# Patient Record
Sex: Female | Born: 1980 | Race: Black or African American | Hispanic: No | Marital: Single | Smoking: Never smoker
Health system: Southern US, Community
[De-identification: ages and names within clinical notes are randomized; demographics above are authoritative.]

## PROBLEM LIST (undated history)

## (undated) DIAGNOSIS — D649 Anemia, unspecified: Secondary | ICD-10-CM

## (undated) DIAGNOSIS — R51 Headache: Secondary | ICD-10-CM

## (undated) DIAGNOSIS — Z789 Other specified health status: Secondary | ICD-10-CM

## (undated) DIAGNOSIS — R519 Headache, unspecified: Secondary | ICD-10-CM

## (undated) DIAGNOSIS — K219 Gastro-esophageal reflux disease without esophagitis: Secondary | ICD-10-CM

## (undated) DIAGNOSIS — R42 Dizziness and giddiness: Secondary | ICD-10-CM

## (undated) HISTORY — PX: NO PAST SURGERIES: SHX2092

---

## 2002-05-03 ENCOUNTER — Emergency Department (HOSPITAL_COMMUNITY): Admission: EM | Admit: 2002-05-03 | Discharge: 2002-05-03 | Payer: Self-pay | Admitting: Emergency Medicine

## 2013-03-19 ENCOUNTER — Other Ambulatory Visit: Payer: Self-pay | Admitting: Internal Medicine

## 2013-03-19 DIAGNOSIS — N63 Unspecified lump in unspecified breast: Secondary | ICD-10-CM

## 2013-03-27 ENCOUNTER — Ambulatory Visit
Admission: RE | Admit: 2013-03-27 | Discharge: 2013-03-27 | Disposition: A | Discharge status: Home / Self Care | Payer: BC Managed Care – PPO | Source: Ambulatory Visit | Attending: Internal Medicine | Admitting: Internal Medicine

## 2013-03-27 DIAGNOSIS — N63 Unspecified lump in unspecified breast: Secondary | ICD-10-CM

## 2013-12-22 ENCOUNTER — Other Ambulatory Visit: Payer: Self-pay | Admitting: Obstetrics and Gynecology

## 2014-01-02 ENCOUNTER — Encounter (HOSPITAL_COMMUNITY): Payer: Self-pay | Admitting: *Deleted

## 2014-01-02 ENCOUNTER — Inpatient Hospital Stay (HOSPITAL_COMMUNITY)
Admission: AD | Admit: 2014-01-02 | Discharge: 2014-01-02 | Disposition: A | Discharge status: Home / Self Care | Payer: BC Managed Care – PPO | Source: Ambulatory Visit | Attending: Obstetrics & Gynecology | Admitting: Obstetrics & Gynecology

## 2014-01-02 DIAGNOSIS — D649 Anemia, unspecified: Secondary | ICD-10-CM | POA: Insufficient documentation

## 2014-01-02 HISTORY — DX: Other specified health status: Z78.9

## 2014-01-02 MED ORDER — SODIUM CHLORIDE 0.9 % IV SOLN
1020.0000 mg | Freq: Once | INTRAVENOUS | Status: AC
  Administered 2014-01-02: 1020 mg via INTRAVENOUS
  Filled 2014-01-02: qty 34

## 2014-01-02 NOTE — MAU Note (Signed)
Patient sent by physician for an Iron infusion today.

## 2014-04-12 ENCOUNTER — Encounter (HOSPITAL_COMMUNITY): Payer: Self-pay | Admitting: *Deleted

## 2014-04-26 ENCOUNTER — Other Ambulatory Visit: Payer: Self-pay | Admitting: Family

## 2014-04-26 DIAGNOSIS — R635 Abnormal weight gain: Secondary | ICD-10-CM

## 2014-04-26 DIAGNOSIS — D352 Benign neoplasm of pituitary gland: Secondary | ICD-10-CM

## 2014-04-26 DIAGNOSIS — D353 Benign neoplasm of craniopharyngeal duct: Secondary | ICD-10-CM

## 2014-05-05 ENCOUNTER — Ambulatory Visit
Admission: RE | Admit: 2014-05-05 | Discharge: 2014-05-05 | Disposition: A | Discharge status: Home / Self Care | Payer: BC Managed Care – PPO | Source: Ambulatory Visit | Attending: Family | Admitting: Family

## 2014-05-05 DIAGNOSIS — R635 Abnormal weight gain: Secondary | ICD-10-CM

## 2014-05-05 DIAGNOSIS — D353 Benign neoplasm of craniopharyngeal duct: Secondary | ICD-10-CM

## 2014-05-05 DIAGNOSIS — D352 Benign neoplasm of pituitary gland: Secondary | ICD-10-CM

## 2014-05-05 MED ORDER — GADOBENATE DIMEGLUMINE 529 MG/ML IV SOLN
10.0000 mL | Freq: Once | INTRAVENOUS | Status: AC | PRN
  Administered 2014-05-05: 10 mL via INTRAVENOUS

## 2014-06-24 ENCOUNTER — Other Ambulatory Visit: Payer: Self-pay | Admitting: Family

## 2014-06-24 ENCOUNTER — Ambulatory Visit
Admission: RE | Admit: 2014-06-24 | Discharge: 2014-06-24 | Disposition: A | Discharge status: Home / Self Care | Payer: BC Managed Care – PPO | Source: Ambulatory Visit | Attending: Family | Admitting: Family

## 2014-06-24 DIAGNOSIS — M2560 Stiffness of unspecified joint, not elsewhere classified: Secondary | ICD-10-CM

## 2014-06-24 DIAGNOSIS — R2689 Other abnormalities of gait and mobility: Secondary | ICD-10-CM

## 2014-06-24 DIAGNOSIS — R29898 Other symptoms and signs involving the musculoskeletal system: Secondary | ICD-10-CM

## 2014-09-23 ENCOUNTER — Other Ambulatory Visit: Payer: Self-pay | Admitting: Family

## 2014-09-23 DIAGNOSIS — R2231 Localized swelling, mass and lump, right upper limb: Secondary | ICD-10-CM

## 2014-09-23 DIAGNOSIS — N6320 Unspecified lump in the left breast, unspecified quadrant: Secondary | ICD-10-CM

## 2014-09-28 ENCOUNTER — Ambulatory Visit
Admission: RE | Admit: 2014-09-28 | Discharge: 2014-09-28 | Disposition: A | Discharge status: Home / Self Care | Payer: BC Managed Care – PPO | Source: Ambulatory Visit | Attending: Family | Admitting: Family

## 2014-09-28 DIAGNOSIS — N6320 Unspecified lump in the left breast, unspecified quadrant: Secondary | ICD-10-CM

## 2014-09-28 DIAGNOSIS — R2231 Localized swelling, mass and lump, right upper limb: Secondary | ICD-10-CM

## 2014-12-06 ENCOUNTER — Other Ambulatory Visit: Payer: Self-pay | Admitting: Endocrinology

## 2014-12-06 DIAGNOSIS — Z87898 Personal history of other specified conditions: Secondary | ICD-10-CM

## 2015-01-03 ENCOUNTER — Other Ambulatory Visit: Payer: BC Managed Care – PPO

## 2015-01-04 ENCOUNTER — Ambulatory Visit
Admission: RE | Admit: 2015-01-04 | Discharge: 2015-01-04 | Disposition: A | Discharge status: Home / Self Care | Payer: BC Managed Care – PPO | Source: Ambulatory Visit | Attending: Endocrinology | Admitting: Endocrinology

## 2015-01-04 DIAGNOSIS — Z87898 Personal history of other specified conditions: Secondary | ICD-10-CM

## 2015-01-04 MED ORDER — GADOBENATE DIMEGLUMINE 529 MG/ML IV SOLN
10.0000 mL | Freq: Once | INTRAVENOUS | Status: AC | PRN
  Administered 2015-01-04: 10 mL via INTRAVENOUS

## 2015-01-07 ENCOUNTER — Other Ambulatory Visit: Payer: BC Managed Care – PPO

## 2015-12-29 ENCOUNTER — Other Ambulatory Visit: Payer: Self-pay | Admitting: Obstetrics and Gynecology

## 2016-01-02 ENCOUNTER — Inpatient Hospital Stay (HOSPITAL_COMMUNITY)
Admission: AD | Admit: 2016-01-02 | Discharge: 2016-01-02 | Disposition: A | Discharge status: Home / Self Care | Payer: BLUE CROSS/BLUE SHIELD | Source: Ambulatory Visit | Attending: Obstetrics and Gynecology | Admitting: Obstetrics and Gynecology

## 2016-01-02 DIAGNOSIS — D509 Iron deficiency anemia, unspecified: Secondary | ICD-10-CM | POA: Insufficient documentation

## 2016-01-02 MED ORDER — SODIUM CHLORIDE 0.9 % IV SOLN
510.0000 mg | Freq: Once | INTRAVENOUS | Status: AC
  Administered 2016-01-02: 510 mg via INTRAVENOUS
  Filled 2016-01-02: qty 17

## 2016-01-02 NOTE — MAU Note (Signed)
Pt presents for iron infusion. Denies complaints at this time.

## 2016-01-02 NOTE — MAU Note (Signed)
Urine in lab 

## 2016-01-09 ENCOUNTER — Other Ambulatory Visit: Payer: Self-pay | Admitting: Obstetrics and Gynecology

## 2016-01-10 ENCOUNTER — Inpatient Hospital Stay (HOSPITAL_COMMUNITY)
Admission: AD | Admit: 2016-01-10 | Discharge: 2016-01-10 | Disposition: A | Discharge status: Home / Self Care | Payer: BLUE CROSS/BLUE SHIELD | Source: Ambulatory Visit | Attending: Obstetrics and Gynecology | Admitting: Obstetrics and Gynecology

## 2016-01-10 DIAGNOSIS — D509 Iron deficiency anemia, unspecified: Secondary | ICD-10-CM | POA: Insufficient documentation

## 2016-01-10 MED ORDER — SODIUM CHLORIDE 0.9 % IV SOLN
510.0000 mg | Freq: Once | INTRAVENOUS | Status: AC
  Administered 2016-01-10: 510 mg via INTRAVENOUS
  Filled 2016-01-10: qty 17

## 2016-01-10 NOTE — Progress Notes (Signed)
Notified of pt arrival in MAU for iron infusion. Ok to discharge home after iron infusion per MD

## 2016-01-10 NOTE — MAU Note (Signed)
Pt here for IV iron infusion. Denies complaints.

## 2016-01-12 ENCOUNTER — Other Ambulatory Visit: Payer: Self-pay | Admitting: Obstetrics and Gynecology

## 2016-01-26 ENCOUNTER — Encounter (HOSPITAL_COMMUNITY)
Admission: RE | Admit: 2016-01-26 | Discharge: 2016-01-26 | Disposition: A | Discharge status: Home / Self Care | Payer: BLUE CROSS/BLUE SHIELD | Source: Ambulatory Visit | Attending: Obstetrics and Gynecology | Admitting: Obstetrics and Gynecology

## 2016-01-26 ENCOUNTER — Encounter (HOSPITAL_COMMUNITY): Payer: Self-pay

## 2016-01-26 DIAGNOSIS — Z01818 Encounter for other preprocedural examination: Secondary | ICD-10-CM | POA: Insufficient documentation

## 2016-01-26 HISTORY — DX: Headache: R51

## 2016-01-26 HISTORY — DX: Dizziness and giddiness: R42

## 2016-01-26 HISTORY — DX: Headache, unspecified: R51.9

## 2016-01-26 HISTORY — DX: Anemia, unspecified: D64.9

## 2016-01-26 HISTORY — DX: Gastro-esophageal reflux disease without esophagitis: K21.9

## 2016-01-26 LAB — CBC
HEMATOCRIT: 33.6 % — AB (ref 36.0–46.0)
Hemoglobin: 10.5 g/dL — ABNORMAL LOW (ref 12.0–15.0)
MCH: 22.4 pg — AB (ref 26.0–34.0)
MCHC: 31.3 g/dL (ref 30.0–36.0)
MCV: 71.8 fL — AB (ref 78.0–100.0)
Platelets: 293 10*3/uL (ref 150–400)
RBC: 4.68 MIL/uL (ref 3.87–5.11)
RDW: 23.9 % — ABNORMAL HIGH (ref 11.5–15.5)
WBC: 2 10*3/uL — ABNORMAL LOW (ref 4.0–10.5)

## 2016-01-26 NOTE — Pre-Procedure Instructions (Signed)
Dr. Ola Spurr made aware of Alexandria Stanley's elevated blood pressure today at preop appointment.  Alexandria Stanley denies headache or visual changes.  She does state her father and paternal grandmother has high blood pressure.  Alexandria Stanley does not currently have a primary care provider.  I notified [place]  via voicemail at Dr. Harvie Bridge office that Dr. Ola Spurr recommended Alexandria Stanley be seen by a PCP prior to surgery for blood pressure management.

## 2016-01-26 NOTE — Patient Instructions (Signed)
Your procedure is scheduled on:  Friday, February 03, 2016  Enter through the Main Entrance of [facility] at:  8:45 AM  Pick up the phone at the desk and dial [phone].  Call this number if you have problems the morning of surgery: [phone].  Remember: Do NOT eat food or drink after:  Midnight Thursday, February 02, 2016  Take these medicines the morning of surgery with a SIP OF WATER:  None  Do NOT wear jewelry (body piercing), metal hair clips/bobby pins, make-up, or nail polish. Do NOT wear lotions, powders, or perfumes.  You may wear deodorant. Do NOT shave for 48 hours prior to surgery. Do NOT bring valuables to the hospital. Contacts, dentures, or bridgework may not be worn into surgery.  Have a responsible adult drive you home and stay with you for 24 hours after your procedure

## 2016-02-03 ENCOUNTER — Encounter (HOSPITAL_COMMUNITY): Payer: Self-pay

## 2016-02-03 ENCOUNTER — Ambulatory Visit (HOSPITAL_COMMUNITY): Payer: BLUE CROSS/BLUE SHIELD | Admitting: Anesthesiology

## 2016-02-03 ENCOUNTER — Ambulatory Visit (HOSPITAL_COMMUNITY)
Admission: RE | Admit: 2016-02-03 | Discharge: 2016-02-03 | Disposition: A | Discharge status: Home / Self Care | Payer: BLUE CROSS/BLUE SHIELD | Source: Ambulatory Visit | Attending: Obstetrics and Gynecology | Admitting: Obstetrics and Gynecology

## 2016-02-03 ENCOUNTER — Encounter (HOSPITAL_COMMUNITY)
Admission: RE | Disposition: A | Discharge status: Home / Self Care | Payer: Self-pay | Source: Ambulatory Visit | Attending: Obstetrics and Gynecology

## 2016-02-03 DIAGNOSIS — D509 Iron deficiency anemia, unspecified: Secondary | ICD-10-CM | POA: Insufficient documentation

## 2016-02-03 DIAGNOSIS — N920 Excessive and frequent menstruation with regular cycle: Secondary | ICD-10-CM | POA: Insufficient documentation

## 2016-02-03 DIAGNOSIS — K219 Gastro-esophageal reflux disease without esophagitis: Secondary | ICD-10-CM | POA: Diagnosis not present

## 2016-02-03 DIAGNOSIS — N840 Polyp of corpus uteri: Secondary | ICD-10-CM | POA: Insufficient documentation

## 2016-02-03 DIAGNOSIS — N9489 Other specified conditions associated with female genital organs and menstrual cycle: Secondary | ICD-10-CM

## 2016-02-03 HISTORY — PX: DILATATION & CURETTAGE/HYSTEROSCOPY WITH MYOSURE: SHX6511

## 2016-02-03 LAB — PREGNANCY, URINE: PREG TEST UR: NEGATIVE

## 2016-02-03 SURGERY — DILATATION & CURETTAGE/HYSTEROSCOPY WITH MYOSURE
Anesthesia: General | Site: Vagina

## 2016-02-03 MED ORDER — SCOPOLAMINE 1 MG/3DAYS TD PT72
1.0000 | MEDICATED_PATCH | Freq: Once | TRANSDERMAL | Status: DC
Start: 2016-02-03 — End: 2016-02-03
  Administered 2016-02-03: 1.5 mg via TRANSDERMAL

## 2016-02-03 MED ORDER — FENTANYL CITRATE (PF) 100 MCG/2ML IJ SOLN
INTRAMUSCULAR | Status: DC | PRN
  Administered 2016-02-03 (×2): 50 ug via INTRAVENOUS

## 2016-02-03 MED ORDER — DEXAMETHASONE SODIUM PHOSPHATE 4 MG/ML IJ SOLN
INTRAMUSCULAR | Status: AC
  Filled 2016-02-03: qty 1

## 2016-02-03 MED ORDER — PROPOFOL 10 MG/ML IV BOLUS
INTRAVENOUS | Status: AC
  Filled 2016-02-03: qty 20

## 2016-02-03 MED ORDER — PROMETHAZINE HCL 25 MG/ML IJ SOLN: 6.2500 mg | INTRAMUSCULAR | Status: DC | PRN

## 2016-02-03 MED ORDER — LIDOCAINE HCL (CARDIAC) 20 MG/ML IV SOLN
INTRAVENOUS | Status: DC | PRN
  Administered 2016-02-03: 100 mg via INTRAVENOUS

## 2016-02-03 MED ORDER — LIDOCAINE HCL (CARDIAC) 20 MG/ML IV SOLN
INTRAVENOUS | Status: AC
  Filled 2016-02-03: qty 5

## 2016-02-03 MED ORDER — FENTANYL CITRATE (PF) 100 MCG/2ML IJ SOLN
INTRAMUSCULAR | Status: AC
  Filled 2016-02-03: qty 2

## 2016-02-03 MED ORDER — PROPOFOL 10 MG/ML IV BOLUS
INTRAVENOUS | Status: DC | PRN
  Administered 2016-02-03: 200 mg via INTRAVENOUS

## 2016-02-03 MED ORDER — FENTANYL CITRATE (PF) 100 MCG/2ML IJ SOLN: 25.0000 ug | INTRAMUSCULAR | Status: DC | PRN

## 2016-02-03 MED ORDER — MIDAZOLAM HCL 2 MG/2ML IJ SOLN
INTRAMUSCULAR | Status: DC | PRN
  Administered 2016-02-03: 2 mg via INTRAVENOUS

## 2016-02-03 MED ORDER — ONDANSETRON HCL 4 MG/2ML IJ SOLN
INTRAMUSCULAR | Status: DC | PRN
  Administered 2016-02-03: 4 mg via INTRAVENOUS

## 2016-02-03 MED ORDER — LACTATED RINGERS IV SOLN
INTRAVENOUS | Status: DC
  Administered 2016-02-03 (×2): via INTRAVENOUS

## 2016-02-03 MED ORDER — SCOPOLAMINE 1 MG/3DAYS TD PT72
MEDICATED_PATCH | TRANSDERMAL | Status: AC
  Administered 2016-02-03: 1.5 mg via TRANSDERMAL
  Filled 2016-02-03: qty 1

## 2016-02-03 MED ORDER — DEXAMETHASONE SODIUM PHOSPHATE 10 MG/ML IJ SOLN
INTRAMUSCULAR | Status: DC | PRN
Start: 2016-02-03 — End: 2016-02-03
  Administered 2016-02-03: 4 mg via INTRAVENOUS

## 2016-02-03 MED ORDER — IBUPROFEN 800 MG PO TABS: 800.0000 mg | ORAL_TABLET | Freq: Three times a day (TID) | ORAL | 0 refills | Status: AC | PRN

## 2016-02-03 MED ORDER — KETOROLAC TROMETHAMINE 30 MG/ML IJ SOLN
INTRAMUSCULAR | Status: DC | PRN
  Administered 2016-02-03: 30 mg via INTRAMUSCULAR
  Administered 2016-02-03: 30 mg via INTRAVENOUS

## 2016-02-03 MED ORDER — HYDRALAZINE HCL 20 MG/ML IJ SOLN
INTRAMUSCULAR | Status: DC
Start: 2016-02-03 — End: 2016-02-03
  Filled 2016-02-03: qty 1

## 2016-02-03 MED ORDER — HYDRALAZINE HCL 20 MG/ML IJ SOLN
5.0000 mg | Freq: Once | INTRAMUSCULAR | Status: AC
  Administered 2016-02-03: 5 mg via INTRAVENOUS

## 2016-02-03 MED ORDER — ONDANSETRON HCL 4 MG/2ML IJ SOLN
INTRAMUSCULAR | Status: AC
  Filled 2016-02-03: qty 2

## 2016-02-03 MED ORDER — KETOROLAC TROMETHAMINE 30 MG/ML IJ SOLN
INTRAMUSCULAR | Status: AC
  Filled 2016-02-03: qty 2

## 2016-02-03 MED ORDER — MIDAZOLAM HCL 2 MG/2ML IJ SOLN
INTRAMUSCULAR | Status: AC
  Filled 2016-02-03: qty 2

## 2016-02-03 MED ORDER — SODIUM CHLORIDE 0.9 % IR SOLN
Status: DC | PRN
  Administered 2016-02-03: 3000 mL

## 2016-02-03 SURGICAL SUPPLY — 20 items
CANISTER SUCT 3000ML (MISCELLANEOUS) ×3 IMPLANT
CATH ROBINSON RED A/P 16FR (CATHETERS) ×3 IMPLANT
CLOTH BEACON ORANGE TIMEOUT ST (SAFETY) ×3 IMPLANT
CONTAINER PREFILL 10% NBF 60ML (FORM) ×6 IMPLANT
DEVICE MYOSURE LITE (MISCELLANEOUS) ×3 IMPLANT
DEVICE MYOSURE REACH (MISCELLANEOUS) IMPLANT
ELECT REM PT RETURN 9FT ADLT (ELECTROSURGICAL) ×3
ELECTRODE REM PT RTRN 9FT ADLT (ELECTROSURGICAL) ×1 IMPLANT
FILTER ARTHROSCOPY CONVERTOR (FILTER) ×3 IMPLANT
GLOVE BIOGEL PI IND STRL 7.0 (GLOVE) ×2 IMPLANT
GLOVE BIOGEL PI INDICATOR 7.0 (GLOVE) ×4
GLOVE ECLIPSE 6.5 STRL STRAW (GLOVE) ×3 IMPLANT
GOWN STRL REUS W/TWL LRG LVL3 (GOWN DISPOSABLE) ×6 IMPLANT
PACK VAGINAL MINOR WOMEN LF (CUSTOM PROCEDURE TRAY) ×3 IMPLANT
PAD OB MATERNITY 4.3X12.25 (PERSONAL CARE ITEMS) ×3 IMPLANT
SEAL ROD LENS SCOPE MYOSURE (ABLATOR) ×3 IMPLANT
TOWEL OR 17X24 6PK STRL BLUE (TOWEL DISPOSABLE) ×6 IMPLANT
TUBING AQUILEX INFLOW (TUBING) ×3 IMPLANT
TUBING AQUILEX OUTFLOW (TUBING) ×3 IMPLANT
WATER STERILE IRR 1000ML POUR (IV SOLUTION) ×3 IMPLANT

## 2016-02-03 NOTE — Anesthesia Procedure Notes (Signed)
Procedure Name: LMA Insertion Date/Time: 02/03/2016 9:50 AM Performed by: Bartt Gonzaga, Sheron Nightingale Pre-anesthesia Checklist: Patient identified, Timeout performed, Emergency Drugs available, Suction available and Patient being monitored Patient Re-evaluated:Patient Re-evaluated prior to inductionOxygen Delivery Method: Circle system utilized Preoxygenation: Pre-oxygenation with 100% oxygen Intubation Type: IV induction LMA Size: 4.0 Number of attempts: 1 ETT to lip (cm): at lips. Tube secured with: Tape Dental Injury: Teeth and Oropharynx as per pre-operative assessment

## 2016-02-03 NOTE — Brief Op Note (Signed)
02/03/2016  10:35 AM  PATIENT:  Alexandria Stanley  35 y.o. female  PRE-OPERATIVE DIAGNOSIS:  Menorrhagia, Iron Deficiency Anemia, Endometrial Mass  POST-OPERATIVE DIAGNOSIS:  Menorrhagia, Iron Deficiency Anemia, Endometrial Mass  PROCEDURE:  Diagnostic hysteroscopy, dilation and curettage, hysteroscopic resection of endometrial polyp using  myosure  SURGEON:  Surgeon(s) and Role:    * Servando Salina, MD - Primary  PHYSICIAN ASSISTANT:   ASSISTANTS: none   ANESTHESIA:   general  EBL:  Total I/O In: -  Out: 27 [Urine:60; Blood:10]  BLOOD ADMINISTERED:none  DRAINS: none   LOCAL MEDICATIONS USED:  NONE  SPECIMEN:  Source of Specimen:  emc with polyp  DISPOSITION OF SPECIMEN:  PATHOLOGY  COUNTS:  YES  TOURNIQUET:  * No tourniquets in log *  DICTATION: .Other Dictation: Dictation Number [id number]  PLAN OF CARE: Discharge to home after PACU  PATIENT DISPOSITION:  PACU - hemodynamically stable.   Delay start of Pharmacological VTE agent (>24hrs) due to surgical blood loss or risk of bleeding: no

## 2016-02-03 NOTE — Anesthesia Preprocedure Evaluation (Addendum)
Anesthesia Evaluation  Patient identified by MRN, date of birth, ID band Patient awake    Reviewed: Allergy & Precautions, NPO status , Patient's Chart, lab work & pertinent test results  Airway Mallampati: II  TM Distance: >3 FB Neck ROM: Full    Dental  (+) Teeth Intact, Dental Advisory Given, Chipped,    Pulmonary neg pulmonary ROS,    Pulmonary exam normal breath sounds clear to auscultation       Cardiovascular Exercise Tolerance: Good negative cardio ROS Normal cardiovascular exam Rhythm:Regular Rate:Normal     Neuro/Psych  Headaches, pituitary micoradenoma negative psych ROS   GI/Hepatic Neg liver ROS, GERD  Medicated,  Endo/Other  Morbid obesity  Renal/GU negative Renal ROS     Musculoskeletal negative musculoskeletal ROS (+)   Abdominal   Peds  Hematology  (+) Blood dyscrasia, anemia ,   Anesthesia Other Findings Day of surgery medications reviewed with the patient.  Reproductive/Obstetrics negative OB ROS                           Anesthesia Physical Anesthesia Plan  ASA: III  Anesthesia Plan: General   Post-op Pain Management:    Induction: Intravenous  Airway Management Planned: LMA  Additional Equipment:   Intra-op Plan:   Post-operative Plan: Extubation in OR  Informed Consent: I have reviewed the patients History and Physical, chart, labs and discussed the procedure including the risks, benefits and alternatives for the proposed anesthesia with the patient or authorized representative who has indicated his/her understanding and acceptance.   Dental advisory given  Plan Discussed with: CRNA  Anesthesia Plan Comments: (Risks/benefits of general anesthesia discussed with patient including risk of damage to teeth, lips, gum, and tongue, nausea/vomiting, allergic reactions to medications, and the possibility of heart attack, stroke and death.  All patient questions  answered.  Patient wishes to proceed.)        Anesthesia Quick Evaluation

## 2016-02-03 NOTE — Discharge Instructions (Signed)
CALL  IF TEMP>100.4, NOTHING PER VAGINA X 1 WK, CALL IF SOAKING A MAXI  PAD EVERY HOUR OR MORE FREQUENTLY  DISCHARGE INSTRUCTIONS: HYSTEROSCOPY  The following instructions have been prepared to help you care for yourself upon your return home.  May Remove Scop patch on or before 02/06/16.  May take Ibuprofen after 4:00pm today.  May take stool softner while taking narcotic pain medication to prevent constipation.  Drink plenty of water.  Personal hygiene:  Use sanitary pads for vaginal drainage, not tampons.  Shower the day after your procedure.  NO tub baths, pools or Jacuzzis for 2-3 weeks.  Wipe front to back after using the bathroom.  Activity and limitations:  Do NOT drive or operate any equipment for 24 hours. The effects of anesthesia are still present and drowsiness may result.  Do NOT rest in bed all day.  Walking is encouraged.  Walk up and down stairs slowly.  You may resume your normal activity in one to two days or as indicated by your physician. Sexual activity: NO intercourse for at least 2 weeks after the procedure, or as indicated by your Doctor.  Diet: Eat a light meal as desired this evening. You may resume your usual diet tomorrow.  Return to Work: You may resume your work activities in one to two days or as indicated by Marine scientist.  What to expect after your surgery: Expect to have vaginal bleeding/discharge for 2-3 days and spotting for up to 10 days. It is not unusual to have soreness for up to 1-2 weeks. You may have a slight burning sensation when you urinate for the first day. Mild cramps may continue for a couple of days. You may have a regular period in 2-6 weeks.  Call your doctor for any of the following:  Excessive vaginal bleeding or clotting, saturating and changing one pad every hour.  Inability to urinate 6 hours after discharge from hospital.  Pain not relieved by pain medication.  Fever of 100.4 F or greater.  Unusual  vaginal discharge or odor.  Return to office _________________Call for an appointment ___________________ Patients signature: ______________________ Nurses signature ________________________  Post Anesthesia Care Unit [phone]      Post Anesthesia Home Care Instructions  Activity: Get plenty of rest for the remainder of the day. A responsible adult should stay with you for 24 hours following the procedure.  For the next 24 hours, DO NOT: -Drive a car -Paediatric nurse -Drink alcoholic beverages -Take any medication unless instructed by your physician -Make any legal decisions or sign important papers.  Meals: Start with liquid foods such as gelatin or soup. Progress to regular foods as tolerated. Avoid greasy, spicy, heavy foods. If nausea and/or vomiting occur, drink only clear liquids until the nausea and/or vomiting subsides. Call your physician if vomiting continues.  Special Instructions/Symptoms: Your throat may feel dry or sore from the anesthesia or the breathing tube placed in your throat during surgery. If this causes discomfort, gargle with warm salt water. The discomfort should disappear within 24 hours.  If you had a scopolamine patch placed behind your ear for the management of post- operative nausea and/or vomiting:  1. The medication in the patch is effective for 72 hours, after which it should be removed.  Wrap patch in a tissue and discard in the trash. Wash hands thoroughly with soap and water. 2. You may remove the patch earlier than 72 hours if you experience unpleasant side effects which may include dry mouth,  dizziness or visual disturbances. 3. Avoid touching the patch. Wash your hands with soap and water after contact with the patch.

## 2016-02-03 NOTE — H&P (Addendum)
Alexandria Stanley is an 35 y.o. female. BF presents for surgical mgmt of heavy menses with associated IDA. Sonogram done showed endometrial mass suggestive of polyps  Pertinent Gynecological History: Menses: flow is excessive with use of 6 pads or tampons on heaviest days Bleeding: menorrhagia Contraception: none DES exposure: denies Blood transfusions: none Sexually transmitted diseases: no past history Previous GYN Procedures: none  Last mammogram: n/a Date: none Last pap: normal Date:12/28/2015 OB History: G1  0010   Menstrual History: Menarche age: n/a Patient's last menstrual period was 01/16/2016 (approximate).    Past Medical History:  Diagnosis Date  . Anemia   . GERD (gastroesophageal reflux disease)   . Headache    Migraine  . Medical history non-contributory   . Vertigo   pituitary micoradenoma Hx IV iron infusions  Past Surgical History:  Procedure Laterality Date  . NO PAST SURGERIES      No family history on file.  Social History:  reports that she has never smoked. She has never used smokeless tobacco. She reports that she drinks alcohol. She reports that she does not use drugs.  Allergies:  Allergies  Allergen Reactions  . Silicone Rash and Other (See Comments)    Causes skin to peel off.    Prescriptions Prior to Admission  Medication Sig Dispense Refill Last Dose  . acetaminophen (TYLENOL) 500 MG tablet Take 1,000 mg by mouth every 6 (six) hours as needed for headache.     . Vitamin D, Ergocalciferol, (DRISDOL) 50000 units CAPS capsule Take 50,000 Units by mouth every 7 (seven) days. Takes on Friday with dinner.  0     Review of Systems  All other systems reviewed and are negative.   Last menstrual period 01/16/2016. Physical Exam  Constitutional: She is oriented to person, place, and time. She appears well-developed and well-nourished.  HENT:  Head: Atraumatic.  Eyes: EOM are normal.  Neck: Neck supple.  Cardiovascular: Regular rhythm.    Respiratory: Breath sounds normal.  GI: Bowel sounds are normal.  Genitourinary: Vagina normal and uterus normal.  Musculoskeletal: She exhibits no edema.  Neurological: She is alert and oriented to person, place, and time.  Skin: Skin is warm and dry.  Psychiatric: She has a normal mood and affect.  adnexa. nonpalp Cervix parous  No results found for this or any previous visit (from the past 24 hour(s)).  No results found.  Assessment/Plan: Menorrhagia with regular cycles IDA Endometrial mass P) dx hysteroscopy,  Hysteroscopic resection of endometrial mass, D&C. Risk of surgery reviewed including infection, bleeding, injury to surrounding organ structures, internal scar tissue, fluid overload and its mgmt, uterine perforation and its risk, thermal injury. All ? answered  Lulamae Skorupski A 02/03/2016, 8:26 AM

## 2016-02-03 NOTE — Transfer of Care (Signed)
Immediate Anesthesia Transfer of Care Note  Patient: Alexandria Stanley  Procedure(s) Performed: Procedure(s): DILATATION & CURETTAGE/HYSTEROSCOPY WITH MYOSURE (N/A)  Patient Location: PACU  Anesthesia Type:General  Level of Consciousness: awake, alert  and oriented  Airway & Oxygen Therapy: Patient Spontanous Breathing and Patient connected to nasal cannula oxygen  Post-op Assessment: Report given to RN and Post -op Vital signs reviewed and stable  Post vital signs: Reviewed and stable  Last Vitals:  Vitals:   02/03/16 1100 02/03/16 1130  BP: (!) 168/98 (!) 149/98  Pulse: (!) 56 63  Resp: 16 16  Temp:  36.7 C    Last Pain:  Vitals:   02/03/16 1100  TempSrc:   PainSc: 2       Patients Stated Pain Goal: 3 ([id number])  Complications: No apparent anesthesia complications

## 2016-02-04 NOTE — Op Note (Signed)
Alexandria Stanley, Alexandria Stanley               ACCOUNT NO.:  [id number]  MEDICAL RECORD NO.:  [id number]  LOCATION:  [facility]                          FACILITY:  [facility]  PHYSICIAN:  Servando Salina, M.D.DATE OF BIRTH:  1980/09/27  DATE OF PROCEDURE:  02/03/2016 DATE OF DISCHARGE:  02/03/2016                              OPERATIVE REPORT   PREOPERATIVE DIAGNOSES: 1. Menorrhagia with regular cycles. 2. Endometrial mass.  PROCEDURE:  Diagnostic hysteroscopy, hysteroscopic resection of endometrial polyps using MyoSure, dilation and curettage.  POSTOPERATIVE DIAGNOSES: 1. Menorrhagia with regular cycles. 2. Endometrial polyp.  ANESTHESIA:  General.  SURGEON:  Servando Salina, MD.  ASSISTANT:  None.  DESCRIPTION OF PROCEDURE:  Under adequate general anesthesia, the patient was placed in a dorsal lithotomy position.  She was sterilely prepped and draped in usual fashion.  Bladder was catheterized mild amount of urine.  Examination under anesthesia revealed anteverted uterus.  No adnexal masses could be appreciated.  Bivalve speculum was placed in the vagina.  A single-tooth tenaculum was placed on the anterior lip of the cervix.  The cervix was then serially dilated up to #[facility] dilator.  The MyoSure hysteroscope was introduced into the uterine cavity.  Small polypoid lesions were noted.  Both tubal ostia were seen.  MyoSure classic Lite resectoscope was then inserted.  The polypoid lesion and thickened endometrium were resected.  The cavity was without any lesions.  The resectoscope was removed.  The cavity was then gently curetted, and then all instruments were removed from the vagina. Specimen labeled endometrial curetting with polyps and sent to Pathology.  Estimated blood loss was minimal.  Complication was none. The patient tolerated the procedure well, was transferred to recovery room in stable condition.     Servando Salina, M.D.    [place]/MEDQ  D:  02/03/2016  T:   02/04/2016  Job:  [id number]

## 2016-02-07 ENCOUNTER — Encounter (HOSPITAL_COMMUNITY): Payer: Self-pay | Admitting: Obstetrics and Gynecology

## 2016-02-14 NOTE — Anesthesia Postprocedure Evaluation (Signed)
Anesthesia Post Note  Patient: Dentist  Procedure(s) Performed: Procedure(s) (LRB): DILATATION & CURETTAGE/HYSTEROSCOPY WITH MYOSURE (N/A)  Patient location during evaluation: PACU Anesthesia Type: General Level of consciousness: awake and alert Pain management: pain level controlled Vital Signs Assessment: post-procedure vital signs reviewed and stable Respiratory status: spontaneous breathing, nonlabored ventilation, respiratory function stable and patient connected to nasal cannula oxygen Cardiovascular status: blood pressure returned to baseline and stable Postop Assessment: no signs of nausea or vomiting Anesthetic complications: no    Last Vitals:  Vitals:   02/03/16 1130 02/03/16 1225  BP: (!) 149/98 (!) 181/103  Pulse: 63 70  Resp: 16 20  Temp: 36.7 C     Last Pain:  Vitals:   02/03/16 1100  TempSrc:   PainSc: 2                  Catalina Gravel

## 2016-09-08 IMAGING — CR DG KNEE 1-2V*L*
2 series · 2 of 2 positions shown · non-contrast
Comparison: None.

CLINICAL DATA: Left knee stiffness.

EXAM:
LEFT KNEE - 1-2 VIEW

[view not recorded (1 of 2)]
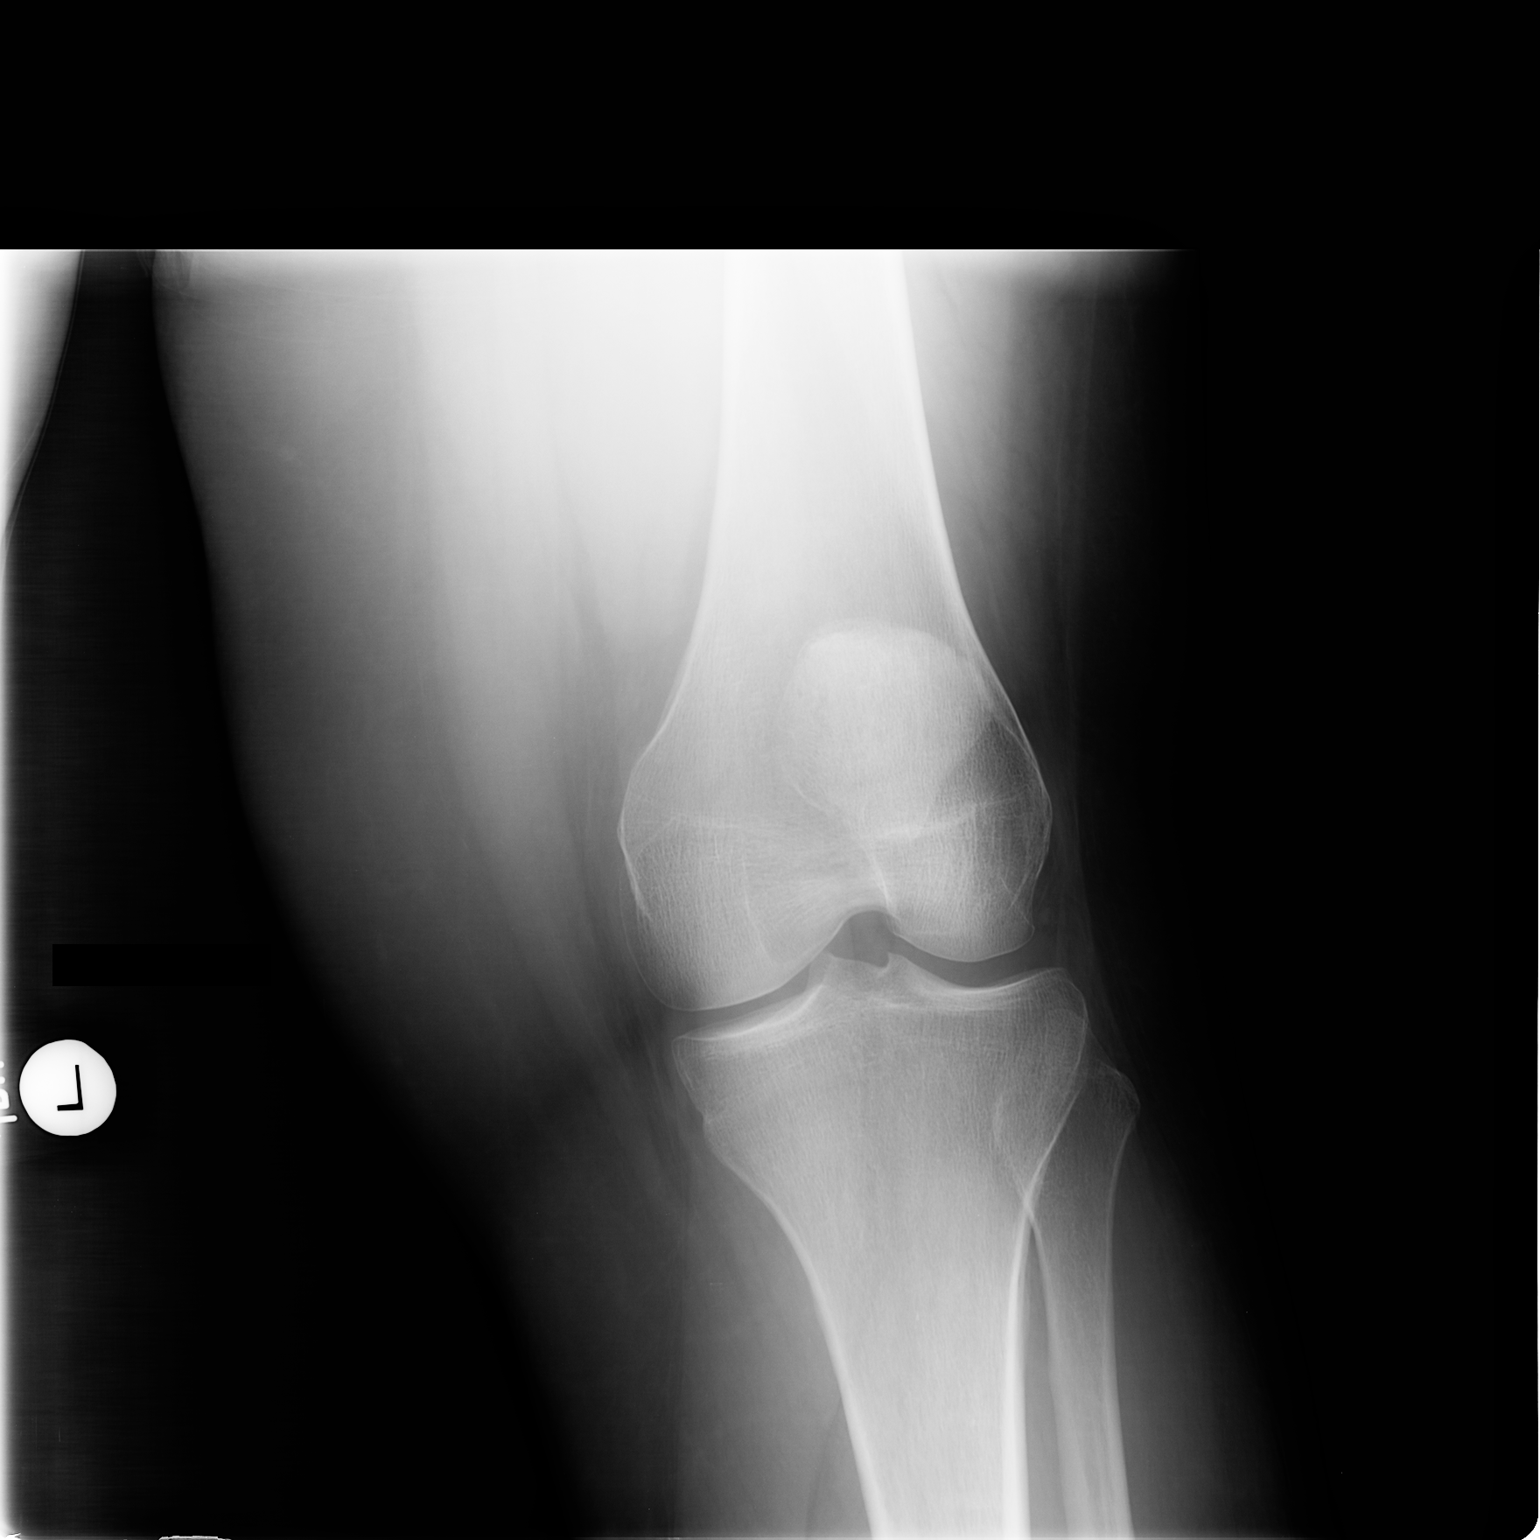

[view not recorded (2 of 2)]
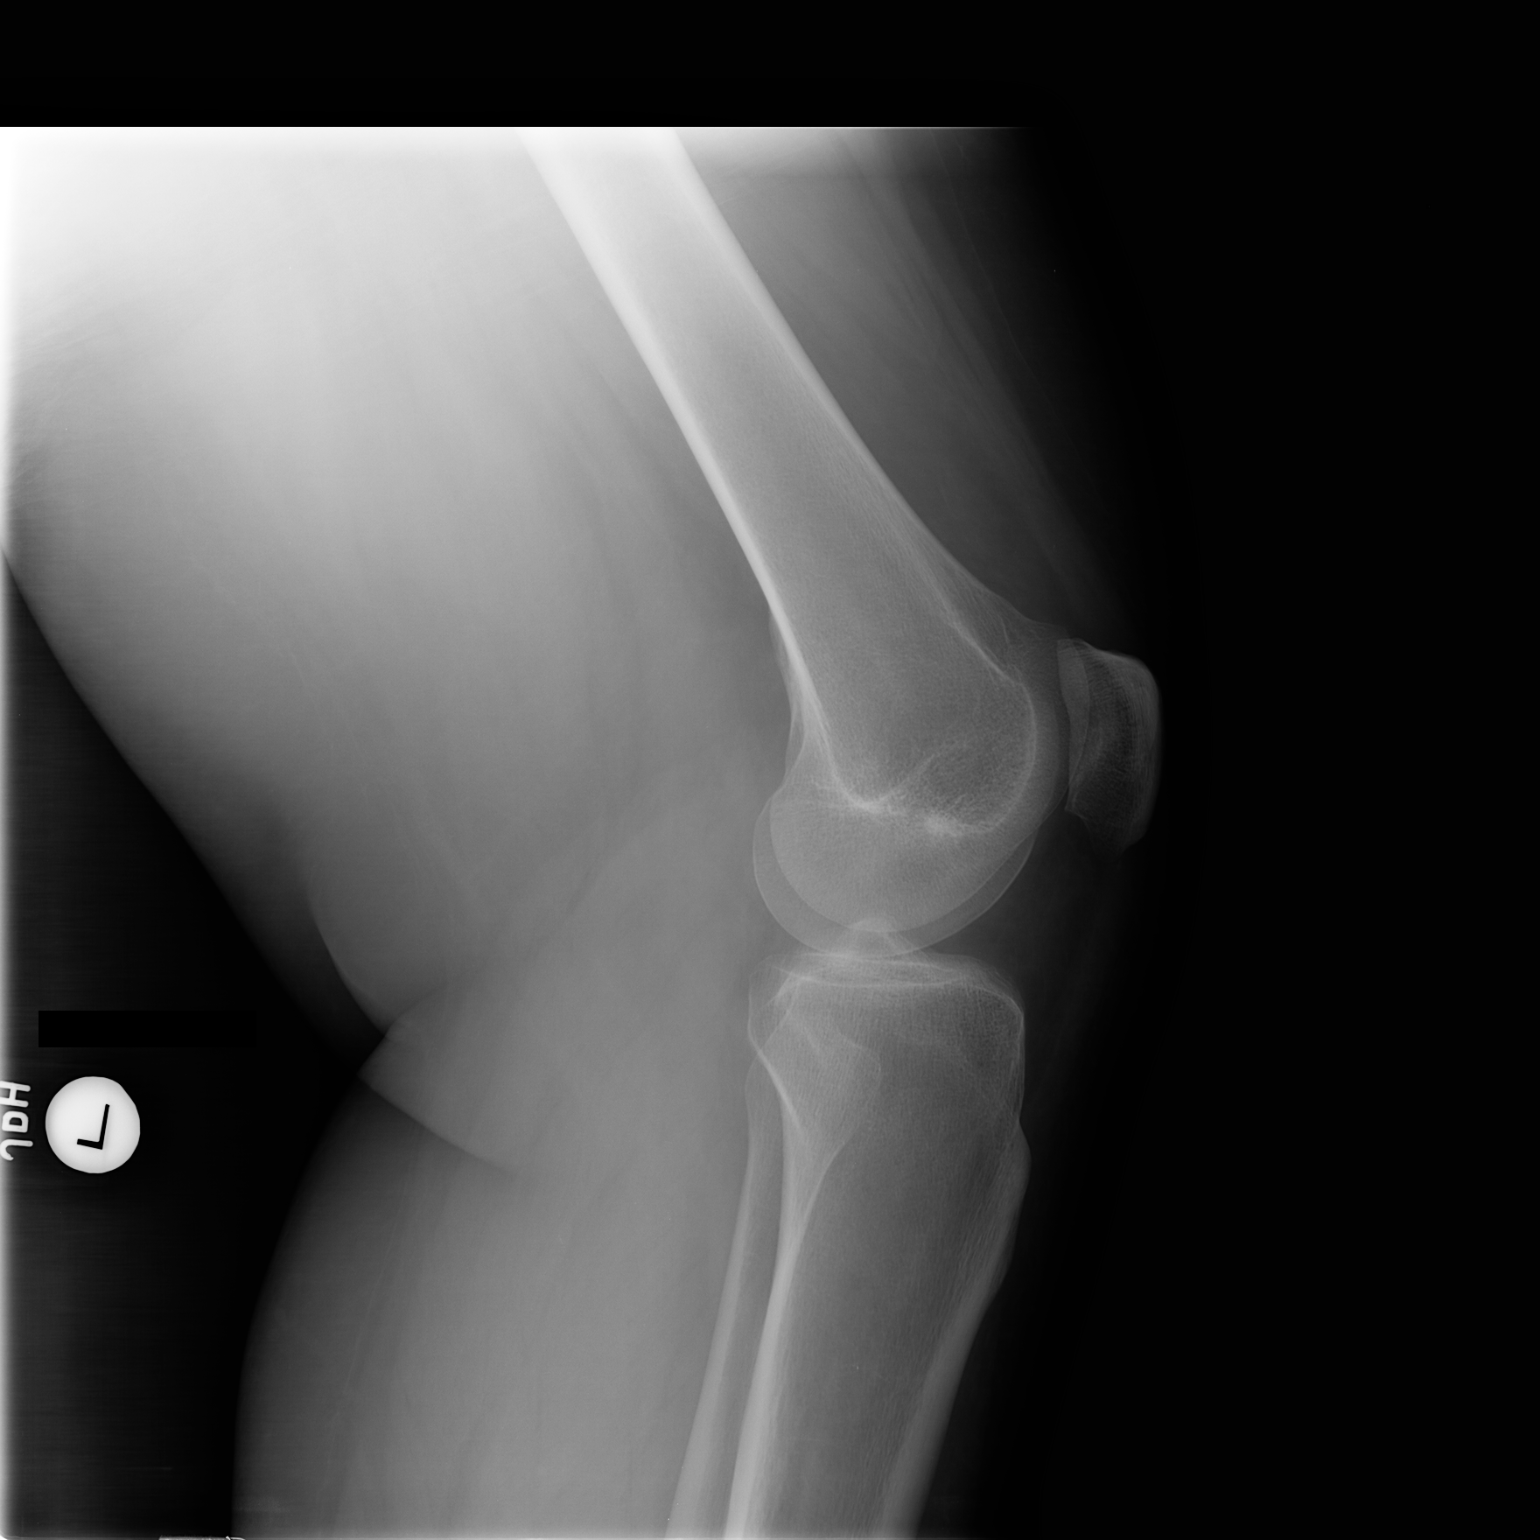

[2 of 2 positions shown; findings below may reference images not displayed]

FINDINGS: There are tiny marginal osteophytes on the patella. The knee joint
otherwise appears normal. No effusions.
IMPRESSION: Minimal degenerative changes of the patella.
